# Patient Record
Sex: Male | Born: 2005 | Race: Black or African American | Hispanic: No | Marital: Single | Smoking: Never smoker
Health system: Southern US, Community
[De-identification: ages and names within clinical notes are randomized; demographics above are authoritative.]

---
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
## 2023-02-13 ENCOUNTER — Emergency Department
Admission: EM | Admit: 2023-02-13 | Discharge: 2023-02-13 | Disposition: A | Discharge status: Home / Self Care | Payer: Self-pay | Attending: Emergency Medicine | Admitting: Emergency Medicine

## 2023-02-13 ENCOUNTER — Other Ambulatory Visit: Payer: Self-pay

## 2023-02-13 DIAGNOSIS — X501XXA Overexertion from prolonged static or awkward postures, initial encounter: Secondary | ICD-10-CM | POA: Insufficient documentation

## 2023-02-13 DIAGNOSIS — S39012A Strain of muscle, fascia and tendon of lower back, initial encounter: Secondary | ICD-10-CM | POA: Insufficient documentation

## 2023-02-13 MED ORDER — IBUPROFEN 800 MG PO TABS: 800.0000 mg | ORAL_TABLET | Freq: Three times a day (TID) | ORAL | 0 refills | Status: AC | PRN

## 2023-02-13 MED ORDER — CYCLOBENZAPRINE HCL 5 MG PO TABS: 5.0000 mg | ORAL_TABLET | Freq: Three times a day (TID) | ORAL | 0 refills | Status: AC | PRN

## 2023-02-13 MED ORDER — CYCLOBENZAPRINE HCL 10 MG PO TABS
10.0000 mg | ORAL_TABLET | Freq: Once | ORAL | Status: AC
  Administered 2023-02-13: 10 mg via ORAL
  Filled 2023-02-13: qty 1

## 2023-02-13 MED ORDER — IBUPROFEN 800 MG PO TABS
800.0000 mg | ORAL_TABLET | Freq: Once | ORAL | Status: AC
  Administered 2023-02-13: 800 mg via ORAL
  Filled 2023-02-13: qty 1

## 2023-02-13 NOTE — ED Provider Notes (Signed)
[facility] Emergency Department Provider Note     Event Date/Time   First MD Initiated Contact with Patient 02/13/23 1825     (approximate)   History   Back Pain   HPI  Donald Dalton is a 17 y.o. male with a noncontributory medical history, presents to the ED for evaluation of intermittent low back pain and disability.  Patient reports lower back pain that is aggravated by twisting motion.  He describes his back "locks up", when he ranges left and right.  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  He denies any bladder or bowel incontinence, foot drop, saddle anesthesia.  Physical Exam   Triage Vital Signs: ED Triage Vitals  Encounter Vitals Group     BP 02/13/23 1754 97/73     Systolic BP Percentile --      Diastolic BP Percentile --      Pulse Rate 02/13/23 1754 84     Resp 02/13/23 1754 20     Temp 02/13/23 1754 99.1 F (37.3 C)     Temp src --      SpO2 02/13/23 1754 96 %     Weight --      Height --      Head Circumference --      Peak Flow --      Pain Score 02/13/23 1754 7     Pain Loc --      Pain Education --      Exclude from Growth Chart --     Most recent vital signs: Vitals:   02/13/23 1754  BP: 97/73  Pulse: 84  Resp: 20  Temp: 99.1 F (37.3 C)  SpO2: 96%    General Awake, no distress. NAD CV:  Good peripheral perfusion.  RESP:  Normal effort.  ABD:  No distention.  MSK:  Normal spinal alignment without midline tenderness, spasm, vomiting, or step-off. NEURO: Cranial nerves II to XII grossly intact.  Normal LE DTRs bilaterally.   ED Results / Procedures / Treatments   Labs (all labs ordered are listed, but only abnormal results are displayed) Labs Reviewed - No data to display   EKG   RADIOLOGY  No results found.   PROCEDURES:  Critical Care performed: No  Procedures   MEDICATIONS ORDERED IN ED: Medications   cyclobenzaprine (FLEXERIL) tablet 10 mg (10 mg Oral Given 02/13/23 1920)  ibuprofen (ADVIL) tablet 800 mg (800 mg Oral Given 02/13/23 1920)     IMPRESSION / MDM / ASSESSMENT AND PLAN / ED COURSE  I reviewed the triage vital signs and the nursing notes.                              Differential diagnosis includes, but is not limited to, lumbar strain, myalgias, lumbar radiculopathy  Patient's presentation is most consistent with acute, uncomplicated illness.  Patient's diagnosis is consistent with lumbar strain.  No acute neuromuscular deficits on exam.  No red flags noted.  Patient will be discharged home with prescriptions for cyclobenzaprine and ibuprofen. Patient is to follow up with a local urgent care as discussed, as needed or otherwise directed. Patient is given ED precautions to return to the ED for any worsening or new symptoms.     FINAL CLINICAL IMPRESSION(S) / ED DIAGNOSES   Final diagnoses:  Strain of lumbar region, initial encounter  Rx / DC Orders   ED Discharge Orders          Ordered    cyclobenzaprine (FLEXERIL) 5 MG tablet  3 times daily PRN        02/13/23 1906    ibuprofen (ADVIL) 800 MG tablet  Every 8 hours PRN        02/13/23 1906             Note:  This document was prepared using Dragon voice recognition software and may include unintentional dictation errors.    Lissa Hoard, PA-C 02/19/23 0003    Minna Antis, MD 02/20/23 1052

## 2023-02-13 NOTE — ED Triage Notes (Signed)
Pt c/o lower back pain that "locks up" with twisting motion. 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 Pt AOX4, NAD noted. Pt denies urinary s/s.

## 2023-03-15 ENCOUNTER — Ambulatory Visit
Admission: EM | Admit: 2023-03-15 | Discharge: 2023-03-15 | Disposition: A | Discharge status: Home / Self Care | Payer: Self-pay | Attending: Emergency Medicine | Admitting: Emergency Medicine

## 2023-03-15 DIAGNOSIS — F17200 Nicotine dependence, unspecified, uncomplicated: Secondary | ICD-10-CM

## 2023-03-15 DIAGNOSIS — B349 Viral infection, unspecified: Secondary | ICD-10-CM | POA: Insufficient documentation

## 2023-03-15 DIAGNOSIS — J029 Acute pharyngitis, unspecified: Secondary | ICD-10-CM | POA: Insufficient documentation

## 2023-03-15 LAB — GROUP A STREP BY PCR: Group A Strep by PCR: NOT DETECTED

## 2023-03-15 NOTE — ED Triage Notes (Signed)
Pt c/o sore throat x3 days & sob since today.

## 2023-03-15 NOTE — ED Provider Notes (Signed)
[facility]    CSN: [id number] Arrival date & time: 03/15/23  1735      History   Chief Complaint Chief Complaint  Patient presents with   Sore Throat   Shortness of Breath    HPI Donald Dalton is a 17 y.o. male.   17 year old male patient, Donald Dalton, presents to urgent care for evaluation of sore throat x 3 days and shortness of breath that started today.  States he works for [employer] unknown illness exposure.  Patient endorses smoking  The history is provided by the patient. No language interpreter was used.    History reviewed. No pertinent past medical history.  Patient Active Problem List   Diagnosis Date Noted   Smoker 03/15/2023   Viral pharyngitis 03/15/2023   Viral illness 03/15/2023    History reviewed. No pertinent surgical history.     Home Medications    Prior to Admission medications   Medication Sig Start Date End Date Taking? Authorizing Provider  cyclobenzaprine (FLEXERIL) 5 MG tablet Take 1 tablet (5 mg total) by mouth 3 (three) times daily as needed. 02/13/23   Menshew, Charlesetta Ivory, PA-C  doxycycline (VIBRA-TABS) 100 MG tablet Take 1 tablet (100 mg total) by mouth 2 (two) times daily. 12/20/21   Darrick Grinder, PA-C  ibuprofen (ADVIL) 800 MG tablet Take 1 tablet (800 mg total) by mouth every 8 (eight) hours as needed. 02/13/23   Menshew, Charlesetta Ivory, PA-C    Family History History reviewed. No pertinent family history.  Social History Social History   Tobacco Use   Smoking status: Never   Smokeless tobacco: Never  Vaping Use   Vaping status: Never Used  Substance Use Topics   Alcohol use: Not Currently   Drug use: Not Currently     Allergies   Patient has no known allergies.   Review of Systems Review of Systems  Constitutional:  Negative for fever.  HENT:  Positive for congestion and sore throat.   Respiratory:  Positive for shortness of breath.   All other systems reviewed and are  negative.    Physical Exam Triage Vital Signs ED Triage Vitals  Encounter Vitals Group     BP 03/15/23 1738 128/84     Systolic BP Percentile --      Diastolic BP Percentile --      Pulse Rate 03/15/23 1738 80     Resp 03/15/23 1738 16     Temp 03/15/23 1738 98 F (36.7 C)     Temp Source 03/15/23 1738 Oral     SpO2 03/15/23 1738 94 %     Weight --      Height --      Head Circumference --      Peak Flow --      Pain Score 03/15/23 1741 5     Pain Loc --      Pain Education --      Exclude from Growth Chart --    No data found.  Updated Vital Signs BP 128/84 (BP Location: Right Arm)   Pulse 80   Temp 98 F (36.7 C) (Oral)   Resp 16   SpO2 94%   Visual Acuity Right Eye Distance:   Left Eye Distance:   Bilateral Distance:    Right Eye Near:   Left Eye Near:    Bilateral Near:     Physical Exam Vitals and nursing note reviewed.  Constitutional:      General: He is  not in acute distress.    Appearance: He is well-developed and well-groomed.  HENT:     Head: Normocephalic and atraumatic.  Eyes:     Conjunctiva/sclera: Conjunctivae normal.  Cardiovascular:     Rate and Rhythm: Normal rate and regular rhythm.     Pulses: Normal pulses.     Heart sounds: Normal heart sounds. No murmur heard. Pulmonary:     Effort: Pulmonary effort is normal. No respiratory distress.     Breath sounds: Normal breath sounds and air entry.  Abdominal:     Palpations: Abdomen is soft.     Tenderness: There is no abdominal tenderness.  Musculoskeletal:        General: No swelling.     Cervical back: Neck supple.  Skin:    General: Skin is warm and dry.     Capillary Refill: Capillary refill takes less than 2 seconds.  Neurological:     General: No focal deficit present.     Mental Status: He is alert and oriented to person, place, and time.     GCS: GCS eye subscore is 4. GCS verbal subscore is 5. GCS motor subscore is 6.     Cranial Nerves: No cranial nerve deficit.      Sensory: No sensory deficit.  Psychiatric:        Mood and Affect: Mood normal.        Behavior: Behavior is cooperative.      UC Treatments / Results  Labs (all labs ordered are listed, but only abnormal results are displayed) Labs Reviewed  GROUP A STREP BY PCR    EKG   Radiology No results found.  Procedures Procedures (including critical care time)  Medications Ordered in UC Medications - No data to display  Initial Impression / Assessment and Plan / UC Course  I have reviewed the triage vital signs and the nursing notes.  Pertinent labs & imaging results that were available during my care of the patient were reviewed by me and considered in my medical decision making (see chart for details).  Clinical Course as of 03/15/23 1819  Tue Mar 15, 2023  1814 Strep negative [JD]    Clinical Course User Index [JD] Zaquan Duffner, Para March, NP   Discussed exam findings and plan of care with patient, strict go to ER precautions given.   Patient verbalized understanding to this provider.  Ddx: viral pharyngitis,viral illness,smoker Final Clinical Impressions(s) / UC Diagnoses   Final diagnoses:  Smoker  Viral pharyngitis  Viral illness     Discharge Instructions      Strep test was negative . Most likely you have a viral illness: no antibiotic as indicated at this time, May treat with OTC meds of choice(Chloraseptic throat lozenges, DayQuil, NyQuil, Mucinex , Delsym ,etc. ). Make sure to drink plenty of fluids to stay hydrated(gatorade, water, popsicles,jello,etc), avoid caffeine products. Follow up with PCP. Return as needed.     ED Prescriptions   None    PDMP not reviewed this encounter.   Clancy Gourd, NP 03/15/23 1819

## 2023-03-15 NOTE — Discharge Instructions (Addendum)
Strep test was negative . Most likely you have a viral illness: no antibiotic as indicated at this time, May treat with OTC meds of choice(Chloraseptic throat lozenges, DayQuil, NyQuil, Mucinex , Delsym ,etc. ). Make sure to drink plenty of fluids to stay hydrated(gatorade, water, popsicles,jello,etc), avoid caffeine products. Follow up with PCP. Return as needed.
